# Patient Record
Sex: Male | Born: 1975 | Race: Black or African American | Hispanic: No | Marital: Single | Smoking: Former smoker
Health system: Southern US, Community
[De-identification: ages and names within clinical notes are randomized; demographics above are authoritative.]

## PROBLEM LIST (undated history)

## (undated) DIAGNOSIS — B20 Human immunodeficiency virus [HIV] disease: Secondary | ICD-10-CM

## (undated) DIAGNOSIS — I499 Cardiac arrhythmia, unspecified: Secondary | ICD-10-CM

## (undated) DIAGNOSIS — Z21 Asymptomatic human immunodeficiency virus [HIV] infection status: Secondary | ICD-10-CM

## (undated) DIAGNOSIS — I10 Essential (primary) hypertension: Secondary | ICD-10-CM

---

## 2015-04-08 ENCOUNTER — Encounter (HOSPITAL_BASED_OUTPATIENT_CLINIC_OR_DEPARTMENT_OTHER): Payer: Self-pay

## 2015-04-08 ENCOUNTER — Emergency Department (HOSPITAL_BASED_OUTPATIENT_CLINIC_OR_DEPARTMENT_OTHER)
Admission: EM | Admit: 2015-04-08 | Discharge: 2015-04-08 | Disposition: A | Discharge status: Home / Self Care | Payer: Self-pay | Attending: Emergency Medicine | Admitting: Emergency Medicine

## 2015-04-08 DIAGNOSIS — I10 Essential (primary) hypertension: Secondary | ICD-10-CM | POA: Insufficient documentation

## 2015-04-08 DIAGNOSIS — R197 Diarrhea, unspecified: Secondary | ICD-10-CM | POA: Insufficient documentation

## 2015-04-08 DIAGNOSIS — R112 Nausea with vomiting, unspecified: Secondary | ICD-10-CM | POA: Insufficient documentation

## 2015-04-08 HISTORY — DX: Cardiac arrhythmia, unspecified: I49.9

## 2015-04-08 HISTORY — DX: Essential (primary) hypertension: I10

## 2015-04-08 MED ORDER — PROMETHAZINE HCL 25 MG PO TABS: 25.0000 mg | ORAL_TABLET | Freq: Four times a day (QID) | ORAL | Status: DC | PRN

## 2015-04-08 MED ORDER — ONDANSETRON 4 MG PO TBDP
4.0000 mg | ORAL_TABLET | Freq: Once | ORAL | Status: AC
  Administered 2015-04-08: 4 mg via ORAL
  Filled 2015-04-08: qty 1

## 2015-04-08 NOTE — ED Provider Notes (Signed)
CSN: [id number]     Arrival date & time 04/08/15  2134 History   None    Chief Complaint  Patient presents with  . Diarrhea     (Consider location/radiation/quality/duration/timing/severity/associated sxs/prior Treatment) HPI   Blood pressure 130/75, pulse 74, temperature 98.5 F (36.9 C), temperature source Oral, resp. rate 18, height  (1.753 m), weight 154 lb (69.854 kg), SpO2 100 %.  TYKEE HEIDEMAN is a 39 y.o. male who is otherwise healthy complaining of 3 episodes of nonbloody, nonbilious, coffee-ground emesis and looser than normal stool onset yesterday. Patient denies fever, chills, abdominal pain, change in urination, sick contacts, recent antibiotics use.   Past Medical History  Diagnosis Date  . Hypertension   . Irregular heart beat    History reviewed. No pertinent past surgical history. No family history on file. Social History  Substance Use Topics  . Smoking status: Never Smoker   . Smokeless tobacco: None  . Alcohol Use: No    Review of Systems  10 systems reviewed and found to be negative, except as noted in the HPI.   Allergies  Review of patient's allergies indicates no known allergies.  Home Medications   Prior to Admission medications   Medication Sig Start Date End Date Taking? Authorizing Provider  UNKNOWN TO PATIENT HTN and med for irregular heart beat   Yes Historical Provider, MD  promethazine (PHENERGAN) 25 MG tablet Take 1 tablet (25 mg total) by mouth every 6 (six) hours as needed for nausea or vomiting. 04/08/15   Rue Tinnel, PA-C   BP 130/75 mmHg  Pulse 74  Temp(Src) 98.5 F (36.9 C) (Oral)  Resp 18  Ht  (1.753 m)  Wt 154 lb (69.854 kg)  BMI 22.73 kg/m2  SpO2 100% Physical Exam  Constitutional: He is oriented to person, place, and time. He appears well-developed and well-nourished. No distress.  HENT:  Head: Normocephalic and atraumatic.  Mouth/Throat: Oropharynx is clear and moist.  Eyes: Conjunctivae and EOM  are normal. Pupils are equal, round, and reactive to light.  Neck: Normal range of motion.  Cardiovascular: Normal rate, regular rhythm and intact distal pulses.   Pulmonary/Chest: Effort normal and breath sounds normal.  Abdominal: Soft. There is no tenderness.  Musculoskeletal: Normal range of motion.  Neurological: He is alert and oriented to person, place, and time.  Skin: He is not diaphoretic.  Psychiatric: He has a normal mood and affect.  Nursing note and vitals reviewed.   ED Course  Procedures (including critical care time) Labs Review Labs Reviewed - No data to display  Imaging Review No results found. I have personally reviewed and evaluated these images and lab results as part of my medical decision-making.   EKG Interpretation None      MDM   Final diagnoses:  Nausea vomiting and diarrhea    Filed Vitals:   04/08/15 2141  BP: 130/75  Pulse: 74  Temp: 98.5 F (36.9 C)  TempSrc: Oral  Resp: 18  Height:  (1.753 m)  Weight: 154 lb (69.854 kg)  SpO2: 100%    Medications  ondansetron (ZOFRAN-ODT) disintegrating tablet 4 mg (not administered)    CHEVY SWEIGERT is a pleasant 39 y.o. male presenting with nausea vomiting diarrhea onset yesterday. No abdominal pain. Abdominal exam is benign. Patient with normal vital signs, does not appear clinically dehydrated. We'll give Zofran ODT and by mouth challenge.   Patient is passed by mouth challenge. Will give work note and Phenergan and  advised on infection control mechanisms.  Evaluation does not show pathology that would require ongoing emergent intervention or inpatient treatment. Pt is hemodynamically stable and mentating appropriately. Discussed findings and plan with patient/guardian, who agrees with care plan. All questions answered. Return precautions discussed and outpatient follow up given.   New Prescriptions   PROMETHAZINE (PHENERGAN) 25 MG TABLET    Take 1 tablet (25 mg total) by mouth every  6 (six) hours as needed for nausea or vomiting.         Wynetta Emery, PA-C 04/08/15 2215  Gwyneth Sprout, MD 04/08/15 2242

## 2015-04-08 NOTE — ED Notes (Signed)
Pt able to drink ginger ale without diff

## 2015-04-08 NOTE — ED Notes (Signed)
C/o n/v/d started yesterday

## 2015-04-08 NOTE — ED Notes (Signed)
Pt c/o diarrhea, n,v x 2 days  Denies pain

## 2015-04-08 NOTE — Discharge Instructions (Signed)
Push fluids: take small frequent sips of water or Gatorade, do not drink any soda, juice or caffeinated beverages.   ° °Slowly resume solid diet as desired. Avoid food that are spicy, contain dairy and/or have high fat content. ° °Aviod NSAIDs (aspirin, motrin, ibuprofen, naproxen, Aleve et cetera) for pain control because they will irritate your stomach. ° °Do not hesitate to return to the emergency room for any new, worsening or concerning symptoms. ° °Please obtain primary care using resource guide below. Let them know that you were seen in the emergency room and that they will need to obtain records for further outpatient management. ° ° ° °Emergency Department Resource Guide °1) Find a Doctor and Pay Out of Pocket °Although you won't have to find out who is covered by your insurance plan, it is a good idea to ask around and get recommendations. You will then need to call the office and see if the doctor you have chosen will accept you as a new patient and what types of options they offer for patients who are self-pay. Some doctors offer discounts or will set up payment plans for their patients who do not have insurance, but you will need to ask so you aren't surprised when you get to your appointment. ° °2) Contact Your Local Health Department °Not all health departments have doctors that can see patients for sick visits, but many do, so it is worth a call to see if yours does. If you don't know where your local health department is, you can check in your phone book. The CDC also has a tool to help you locate your state's health department, and many state websites also have listings of all of their local health departments. ° °3) Find a Walk-in Clinic °If your illness is not likely to be very severe or complicated, you may want to try a walk in clinic. These are popping up all over the country in pharmacies, drugstores, and shopping centers. They're usually staffed by nurse practitioners or physician assistants  that have been trained to treat common illnesses and complaints. They're usually fairly quick and inexpensive. However, if you have serious medical issues or chronic medical problems, these are probably not your best option. ° °No Primary Care Doctor: °- Call [facility] at  832-8000 - they can help you locate a primary care doctor that  accepts your insurance, provides certain services, etc. °- Physician Referral Service- 1-800-533-3463 ° °Chronic Pain Problems: °Organization         Address  Phone   Notes  °[facility]  (336) [phone] Patients need to be referred by their primary care doctor.  ° °Medication Assistance: °Organization         Address  Phone   Notes  °[place] [facility] [address] °[place], [place] [address] (336) 641-8030 --Must be a resident of [place] °-- Must have NO insurance coverage whatsoever (no Medicaid/ Medicare, etc.) °-- The pt. MUST have a primary care doctor that directs their care regularly and follows them in the community °  °[facility]  (866) 331-1348   °[employer]  (888) 892-1162   ° °Agencies that provide inexpensive medical care: °Organization         Address  Phone   Notes  °[facility]  (336) 832-8035   °[facility]    (336) 832-7272   °[facility] [address] °[place], [place] [address] (336)   832-4777   °[facility] [address], °[place] (336) 271-4999   °[facility]    (336) 373-0678   °[facility]    (336) 272-1050   °[facility] ° [address], [place] Phone:  (336) 832-4444, Fax:  (336) 832-4440 Hours of Operation:  9 am - 6 pm, M-F.  Also accepts Medicaid/Medicare and self-pay.  °[facility] ° [address], [place] Phone: (336) 832-3150, Fax: (336) [phone]. Hours of Operation:  8:30 am - 5:30 pm, M-F.  Also accepts Medicaid  and self-pay.  °[facility] [place] [address], [place] Phone: (336) 878-6027   °[facility] [address], [place], [place] (336)723-1848, Ext. 123 Mondays & Thursdays: 7-9 AM.  First 15 patients are seen on a first come, first serve basis. °  ° °Medicaid-accepting [place] Providers: ° °Organization         Address  Phone   Notes  °[facility] [address], [place] (336) 641-2100 Also accepts self-pay patients.  °[facility] [address], [place] ° (336) 856-9996   °[facility] [address], [place] (336) 288-8857   °[facility] [address] [place] [address], [place] (336) 299-7000   °[facility] [address], [place]  ° (336) 373-1557 Only accepts [place] Access Medicaid patients after they have their name applied to their card.  ° °Self-Pay (no insurance) in [place]: ° °Organization         Address  Phone   Notes  °Sickle Cell Patients, [facility] [address], [place] (336) 832-1970   °[facility] [address], [place] (336) 832-4400   °[facility] [place] ° [address], [place] (336) 992-4800   °[facility]/Dr. Osei-Bonsu ° [address] [place] [address], [place] or [address], [place] (336) [phone] Phone number for both [place] and [place] locations is the same.  °[facility] [address], [place] (336) 299-0000   °[facility] [address] [place] [address], [place] or [address] (336) 852-7530 °(336) 878-2260   °[facility] [address], [place] (336) 350-1642, phone; (336) 294-5005, fax Sees patients 1st and 3rd Saturday of every month.  Must not qualify for public or private insurance (i.e. Medicaid, Medicare, [place] Health Choice, Veterans' Benefits) • Household income  should be no more than 200% of the poverty level •The clinic cannot treat you if you are pregnant or think you are pregnant • Sexually transmitted diseases are not treated at the clinic.  ° ° °Dental Care: °Organization         Address  Phone  Notes  °[facility] [facility] [address], [place] (336) 641-6152 Accepts children up to age 21 who are enrolled in Medicaid or [place] Health Choice; pregnant women with a Medicaid card; and children who have applied for Medicaid or [place] Health Choice, but were declined, whose parents can pay a reduced fee at time of service.  °[facility] [place]  [address], [place] (336) 641-7733 Accepts children up to age 21 who are enrolled in Medicaid or [place] Health Choice; pregnant women with a   Medicaid card; and children who have applied for Medicaid or [place] Health Choice, but were declined, whose parents can pay a reduced fee at time of service.  °[facility] ° [address], [place] (336) 641-4533 Patients are seen by appointment only. Walk-ins are not accepted. [facility] will see patients 18 years of age and older. °Monday - Tuesday (8am-5pm) °Most Wednesdays (8:30-5pm) °$30 per visit, cash only  °[facility] ° [address], [place] (336) 641-4533 Patients are seen by appointment only. Walk-ins are not accepted. [facility] will see patients 18 years of age and older. °One Wednesday Evening (Monthly: Volunteer Based).  $30 per visit, cash only  °[facility]  (919) 537-3737 for adults; Children under age 4, call Graduate Pediatric Dentistry at (919) [phone]. Children aged 4-14, please call (919) [phone] to request a pediatric application. ° Dental services are provided in all areas of dental care including fillings, crowns and bridges, complete and partial dentures, implants, gum treatment,  root canals, and extractions. Preventive care is also provided. Treatment is provided to both adults and children. °Patients are selected via a lottery and there is often a waiting list. °  °[facility] [address], °[place] ° (336) 763-8833 [email or web address] °  °[facility] [address], [place], [place] (336)723-1848, Ext. 123 Second and Fourth Thursday of each month, opens at 6:30 AM; Clinic ends at 9 AM.  Patients are seen on a first-come first-served basis, and a limited number are seen during each clinic.  ° °[facility] ° [address], [place], [place] (336) 723-7904   Eligibility Requirements °You must have lived in [place], [place], or [place] counties for at least the last three months. °  You cannot be eligible for state or federal sponsored healthcare insurance, including Veterans Administration, Medicaid, or Medicare. °  You generally cannot be eligible for healthcare insurance through your employer.  °  How to apply: °Eligibility screenings are held every Tuesday and Wednesday afternoon from 1:00 pm until 4:00 pm. You do not need an appointment for the interview!  °[facility] [address], [place], [place] 336-631-2330   °[facility]  336-342-8273   °[facility]  336-703-3100   °[facility]  336-570-6415   ° °Behavioral Health Resources in the Community: °Intensive Outpatient Programs °Organization         Address  Phone  Notes  °[facility] [address], [place], [place] 336-878-6098   °[facility] [address], [place], [place] 336-832-9800   °[facility] [address], [place], [place] ° 336-882-2125   °[facility] [address],  °[place], [place] 1-800-853-5163 or 336-641-4981   °Substance Abuse Resources °Organization         Address  Phone  Notes  °[facility]   336-882-2125   °[facility]  336-784-9470   °[facility]  336-285-9073   °[facility]  336-845-3988   °Residential & Outpatient Substance Abuse Program  1-800-659-3381   °Psychological Services °Organization         Address  Phone  Notes  °[facility]  336- 832-9600   °[facility]  336- 378-7881   °[facility] [address], [place] 1-800-853-5163 or 336-641-4981   ° °Mobile Crisis Teams °Organization           Address  Phone  Notes  [facility], Mobile Crisis Care Unit  (253)089-5980   [facility]  [address] [place], [place]   [facility] [address] [place] (249)249-7372    Self-Help/Support Groups Organization         Address  Phone             Notes  [place] [place] - variety of support groups  [place] Call for more information  Narcotics Anonymous (NA), [facility] [address], [employer] [place] [place]  2 meetings at this location   Special educational needs teacher         Address  Phone  Notes  [facility] [place],    [place]  1-236-465-4451   [facility]  [address] [place] [address], [place], [place]   [place], [place] (830)401-9842 Admissions: 8am-3pm M-F  [facility] [address]    [place], [place] 151-761-6073   [facility] [address] [place], [place], [place]   [facility] [address]  [place], [place]   [facility] - Intensive Outpatient [address], [place], [place]   [facility] ([place] [place].,  [place], [place] 1-7316549183 or 201-438-0559   [facility] [address], [place], [place] Accepts Medicaid  [facility] [address],  [place] 1-281-729-8089 Substance Abuse/Addiction Treatment   [facility] Organization         Address  Phone  Notes  [facility]  6407101468   Domenic Schwab, PhD [address] [place], [place]   909-847-1489 or 867-309-0760   [place], [place] 269-275-4843   [facility] [address] [place], [place] 609-342-2648 Insurance/Medicaid/sponsorship through [facility] [address]                                    [place], [place] [phone] [place] [address], [place] 339-383-0842    Dr. Adele Schilder  516-089-2527   [facility] Dept. 1) [address], [place] 2) [place] 3)  [address], [place] [phone]  [phone]   [place] (810)[phone] or (941)[phone] (After Hours)

## 2017-08-04 ENCOUNTER — Other Ambulatory Visit: Payer: Self-pay

## 2017-08-04 ENCOUNTER — Encounter (HOSPITAL_BASED_OUTPATIENT_CLINIC_OR_DEPARTMENT_OTHER): Payer: Self-pay

## 2017-08-04 ENCOUNTER — Emergency Department (HOSPITAL_BASED_OUTPATIENT_CLINIC_OR_DEPARTMENT_OTHER)
Admission: EM | Admit: 2017-08-04 | Discharge: 2017-08-04 | Disposition: A | Discharge status: Home / Self Care | Payer: 59 | Attending: Emergency Medicine | Admitting: Emergency Medicine

## 2017-08-04 DIAGNOSIS — R51 Headache: Secondary | ICD-10-CM

## 2017-08-04 DIAGNOSIS — R519 Headache, unspecified: Secondary | ICD-10-CM

## 2017-08-04 DIAGNOSIS — Z79899 Other long term (current) drug therapy: Secondary | ICD-10-CM | POA: Diagnosis not present

## 2017-08-04 DIAGNOSIS — Z87891 Personal history of nicotine dependence: Secondary | ICD-10-CM | POA: Diagnosis not present

## 2017-08-04 DIAGNOSIS — I10 Essential (primary) hypertension: Secondary | ICD-10-CM | POA: Insufficient documentation

## 2017-08-04 DIAGNOSIS — R197 Diarrhea, unspecified: Secondary | ICD-10-CM | POA: Diagnosis not present

## 2017-08-04 DIAGNOSIS — R112 Nausea with vomiting, unspecified: Secondary | ICD-10-CM

## 2017-08-04 DIAGNOSIS — F1210 Cannabis abuse, uncomplicated: Secondary | ICD-10-CM | POA: Diagnosis not present

## 2017-08-04 LAB — URINALYSIS, ROUTINE W REFLEX MICROSCOPIC
Bilirubin Urine: NEGATIVE
Glucose, UA: NEGATIVE mg/dL
Hgb urine dipstick: NEGATIVE
Ketones, ur: NEGATIVE mg/dL
Leukocytes, UA: NEGATIVE
Nitrite: NEGATIVE
Protein, ur: NEGATIVE mg/dL
Specific Gravity, Urine: 1.02 (ref 1.005–1.030)
pH: 6.5 (ref 5.0–8.0)

## 2017-08-04 LAB — COMPREHENSIVE METABOLIC PANEL
ALT: 16 U/L — ABNORMAL LOW (ref 17–63)
AST: 17 U/L (ref 15–41)
Albumin: 4 g/dL (ref 3.5–5.0)
Alkaline Phosphatase: 50 U/L (ref 38–126)
Anion gap: 7 (ref 5–15)
BUN: 11 mg/dL (ref 6–20)
CO2: 26 mmol/L (ref 22–32)
Calcium: 9.2 mg/dL (ref 8.9–10.3)
Chloride: 107 mmol/L (ref 101–111)
Creatinine, Ser: 0.95 mg/dL (ref 0.61–1.24)
GFR calc Af Amer: >60 mL/min (ref >60)
GFR calc non Af Amer: >60 mL/min (ref >60)
Glucose, Bld: 101 mg/dL — ABNORMAL HIGH (ref 65–99)
Potassium: 4 mmol/L (ref 3.5–5.1)
Sodium: 140 mmol/L (ref 135–145)
Total Bilirubin: 0.4 mg/dL (ref 0.3–1.2)
Total Protein: 7.1 g/dL (ref 6.5–8.1)

## 2017-08-04 LAB — CBC WITH DIFFERENTIAL/PLATELET
Basophils Absolute: 0 10*3/uL (ref 0.0–0.1)
Basophils Relative: 1 %
Eosinophils Absolute: 0.3 10*3/uL (ref 0.0–0.7)
Eosinophils Relative: 8 %
HCT: 41.8 % (ref 39.0–52.0)
Hemoglobin: 14.9 g/dL (ref 13.0–17.0)
Lymphocytes Relative: 55 %
Lymphs Abs: 2.3 10*3/uL (ref 0.7–4.0)
MCH: 35.8 pg — ABNORMAL HIGH (ref 26.0–34.0)
MCHC: 35.6 g/dL (ref 30.0–36.0)
MCV: 100.5 fL — ABNORMAL HIGH (ref 78.0–100.0)
Monocytes Absolute: 0.5 10*3/uL (ref 0.1–1.0)
Monocytes Relative: 12 %
Neutro Abs: 1 10*3/uL — ABNORMAL LOW (ref 1.7–7.7)
Neutrophils Relative %: 24 %
Platelets: 142 10*3/uL — ABNORMAL LOW (ref 150–400)
RBC: 4.16 MIL/uL — ABNORMAL LOW (ref 4.22–5.81)
RDW: 13.1 % (ref 11.5–15.5)
WBC: 4.2 10*3/uL (ref 4.0–10.5)

## 2017-08-04 LAB — LIPASE, BLOOD: Lipase: 94 U/L — ABNORMAL HIGH (ref 11–51)

## 2017-08-04 MED ORDER — SODIUM CHLORIDE 0.9 % IV BOLUS (SEPSIS)
1000.0000 mL | Freq: Once | INTRAVENOUS | Status: AC
  Administered 2017-08-04: 1000 mL via INTRAVENOUS

## 2017-08-04 MED ORDER — ONDANSETRON 4 MG PO TBDP: 4.0000 mg | ORAL_TABLET | Freq: Three times a day (TID) | ORAL | 0 refills | Status: DC | PRN

## 2017-08-04 MED ORDER — ONDANSETRON HCL 4 MG/2ML IJ SOLN
4.0000 mg | Freq: Once | INTRAMUSCULAR | Status: AC
  Administered 2017-08-04: 4 mg via INTRAVENOUS
  Filled 2017-08-04: qty 2

## 2017-08-04 NOTE — ED Triage Notes (Signed)
C/o HA, light headed, n/v started yesterday-diarrhea today-NAD-steady gait

## 2017-08-04 NOTE — ED Provider Notes (Signed)
[facility] EMERGENCY DEPARTMENT Provider Note   CSN: [id number] Arrival date & time: 08/04/17  1133     History   Chief Complaint Chief Complaint  Patient presents with  . Headache    HPI Thomas Jimenez is a 42 y.o. male with history of hypertension, Wolff-Parkinson-White syndrome, and HIV presents today for evaluation of gradual onset, progressively worsening frontal headache which began at around 8 PM last night.  He states that the headache was initially intermittent but is now constant and throbbing in nature, occasionally sharp.  The pain will occasionally radiate to the side of his head and neck depending on his position.  He denies vision changes or photophobia or phonophobia.  He endorses 4 episodes of nonbloody nonbilious emesis and 3 episodes of nonbloody watery stools.  He endorses aching abdominal pain during emesis only but no pain otherwise.  He denies chest pain or shortness of breath.  He endorses lightheadedness but no syncope.  He has not tried anything for his symptoms.  He has known sick contacts but is unsure of their symptoms.  He has been compliant with his HIV medicine and has not missed any doses, viral load was undetectable the last time he followed up with infectious disease.  He denies any recent travel outside of the country.  The history is provided by the patient.    Past Medical History:  Diagnosis Date  . Hypertension   . Irregular heart beat     There are no active problems to display for this patient.   History reviewed. No pertinent surgical history.     Home Medications    Prior to Admission medications   Medication Sig Start Date End Date Taking? Authorizing Provider  CARVEDILOL PO Take by mouth.   Yes [provider]  ondansetron (ZOFRAN ODT) 4 MG disintegrating tablet Take 1 tablet (4 mg total) by mouth every 8 (eight) hours as needed for nausea or vomiting. 08/04/17   Jeanie SewerFawze, Atthew Coutant A, PA-C    Family History No  family history on file.  Social History Social History   Tobacco Use  . Smoking status: Former Games developermoker  . Smokeless tobacco: Never Used  Substance Use Topics  . Alcohol use: Yes    Comment: occ  . Drug use: Yes    Types: Marijuana     Allergies   Patient has no known allergies.   Review of Systems Review of Systems  Constitutional: Negative for chills and fever.  Eyes: Negative for photophobia and visual disturbance.  Respiratory: Negative for shortness of breath.   Cardiovascular: Negative for chest pain.  Gastrointestinal: Positive for abdominal pain (with emesis only), diarrhea, nausea and vomiting. Negative for blood in stool.  Genitourinary: Negative for dysuria and hematuria.  Musculoskeletal: Negative for neck pain and neck stiffness.  Neurological: Positive for light-headedness and headaches. Negative for syncope, weakness and numbness.  All other systems reviewed and are negative.    Physical Exam Updated Vital Signs BP 129/88   Pulse (!) 50   Temp 99.1 F (37.3 C) (Oral)   Resp 16   Ht 5\' 9"  (1.753 m)   Wt 70.8 kg (156 lb)   SpO2 100%   BMI 23.04 kg/m   Physical Exam  Constitutional: He is oriented to person, place, and time. He appears well-developed and well-nourished. No distress.  HENT:  Head: Normocephalic and atraumatic.  No tenderness to palpation of the face or skull. No deformity, crepitus, or swelling noted.   Eyes: Conjunctivae  and EOM are normal. Pupils are equal, round, and reactive to light. Right eye exhibits no discharge. Left eye exhibits no discharge. Right eye exhibits normal extraocular motion and no nystagmus. Left eye exhibits normal extraocular motion and no nystagmus.  Neck: Normal range of motion. Neck supple. No JVD present. No neck rigidity. No tracheal deviation present. No Brudzinski's sign and no Kernig's sign noted.  No midline spine TTP, no paraspinal muscle tenderness, no deformity, crepitus, or step-off noted     Cardiovascular: Normal rate, regular rhythm, normal heart sounds and intact distal pulses.  Pulmonary/Chest: Effort normal and breath sounds normal.  Abdominal: Soft. Bowel sounds are normal. He exhibits no distension and no mass. There is no tenderness. There is no guarding.  No CVAT  Musculoskeletal: Normal range of motion. He exhibits no edema or tenderness.  Neurological: He is alert and oriented to person, place, and time. He has normal strength. He is not disoriented. No cranial nerve deficit or sensory deficit. Coordination normal. GCS eye subscore is 4. GCS verbal subscore is 5. GCS motor subscore is 6.  Mental Status:  Alert, thought content appropriate, able to give a coherent history. Speech fluent without evidence of aphasia. Able to follow 2 step commands without difficulty.  Cranial Nerves:  II:  pupils equal, round, reactive to light III,IV, VI: ptosis not present, extra-ocular motions intact bilaterally  V,VII: smile symmetric, facial light touch sensation equal VIII: hearing grossly normal to voice  X: uvula elevates symmetrically  XI: bilateral shoulder shrug symmetric and strong XII: midline tongue extension without fassiculations Motor:  Normal tone. 5/5 strength of BUE and BLE major muscle groups including strong and equal grip strength and dorsiflexion/plantar flexion Sensory: light touch normal in all extremities. Cerebellar: normal finger-to-nose with bilateral upper extremities Gait: normal gait and balance. CV: 2+ radial and DP/PT pulses   Skin: Skin is warm and dry. No erythema.  Psychiatric: He has a normal mood and affect. His behavior is normal.  Nursing note and vitals reviewed.    ED Treatments / Results  Labs (all labs ordered are listed, but only abnormal results are displayed) Labs Reviewed  COMPREHENSIVE METABOLIC PANEL - Abnormal; Notable for the following components:      Result Value   Glucose, Bld 101 (*)    ALT 16 (*)    All other  components within normal limits  CBC WITH DIFFERENTIAL/PLATELET - Abnormal; Notable for the following components:   RBC 4.16 (*)    MCV 100.5 (*)    MCH 35.8 (*)    Platelets 142 (*)    Neutro Abs 1.0 (*)    All other components within normal limits  LIPASE, BLOOD - Abnormal; Notable for the following components:   Lipase 94 (*)    All other components within normal limits  URINALYSIS, ROUTINE W REFLEX MICROSCOPIC    EKG  EKG Interpretation  Date/Time:  Wednesday August 04 2017 12:17:15 EST Ventricular Rate:  60 PR Interval:    QRS Duration: 100 QT Interval:  389 QTC Calculation: 389 R Axis:   70 Text Interpretation:  Sinus arrhythmia otherwise no acute ST/T changes No old tracing to compare Confirmed by Pricilla Loveless [phone]) on 08/04/2017 12:35:14 PM       Radiology No results found.  Procedures Procedures (including critical care time)  Medications Ordered in ED Medications  sodium chloride 0.9 % bolus 1,000 mL (0 mLs Intravenous Stopped 08/04/17 1303)  ondansetron (ZOFRAN) injection 4 mg (4 mg Intravenous Given 08/04/17 1226)  Initial Impression / Assessment and Plan / ED Course  I have reviewed the triage vital signs and the nursing notes.  Pertinent labs & imaging results that were available during my care of the patient were reviewed by me and considered in my medical decision making (see chart for details).     Patient presents with a few episodes of nausea, vomiting, and diarrhea as well as a mild 3/10 frontal headache.  Afebrile, vital signs are at patient's baseline.  He is nontoxic in appearance.  He is HIV positive, however he is compliant with his medication and viral load was undetectable at last infectious disease visit with a healthy CD4 count.  No nuchal rigidity or meningeal signs to suggest meningitis.  No focal neurologic deficits on examination and I doubt ICH, SAH, or acute intracranial abnormality.  Abdomen is nontender and unremarkable on  examination.  UA is  unconcerning for UTI or nephrolithiasis.  No leukocytosis or anemia.  No significant electrolyte abnormalities.  LFTs and creatinine are within normal limits.  Lipase is mildly elevated however not so elevated as to be concerning for acute pancreatitis.  On reevaluation, patient is resting comfortably and states he is feeling much better after fluids and Zofran.  He is tolerating p.o. food and fluids without difficulty.  Repeat abdominal examination remains unremarkable.  No further emergent workup required at this time.  I doubt obstruction, perforation, appendicitis, or other acute surgical abdominal pathology.  He is stable for discharge home with supportive treatment and Zofran.  Discussed indications for return to the ED.  He will follow-up with his primary care physician for reevaluation of symptoms if they persist. Pt verbalized understanding of and agreement with plan and is safe for discharge home at this time.  He has no complaints prior to discharge.  Final Clinical Impressions(s) / ED Diagnoses   Final diagnoses:  Nausea vomiting and diarrhea  Frontal headache    ED Discharge Orders        Ordered    ondansetron (ZOFRAN ODT) 4 MG disintegrating tablet  Every 8 hours PRN     08/04/17 1407       Jeanie Sewer, PA-C 08/04/17 1417    Pricilla Loveless, MD 08/04/17 1458

## 2017-08-04 NOTE — Discharge Instructions (Signed)
Alternate 600 mg of ibuprofen and [phone] mg of Tylenol every 3 hours as needed for pain. Do not exceed 4000 mg of Tylenol daily.  Take Zofran as needed for nausea and vomiting.  Wait around 20 minutes prior to eating something after taking this medicine.  Advance the diet slowly over the next few days.  Drink plenty of fluids and get plenty of rest.  Eat a diet of bland foods that will not upset your stomach such as clear broth, mashed potatoes, saltine crackers, ginger ale, and plain chicken.  Follow-up with your primary care physician if symptoms persist.  Return to the emergency department immediately for any concerning signs or symptoms develop such as fevers, neck stiffness, severe pain, blood in the urine or stool, or if you are unable to keep any food or drink down.

## 2017-08-04 NOTE — ED Notes (Signed)
Graham crackers and Ginger Ale given for PO challenge.

## 2018-03-18 ENCOUNTER — Other Ambulatory Visit: Payer: Self-pay

## 2018-03-18 ENCOUNTER — Encounter (HOSPITAL_BASED_OUTPATIENT_CLINIC_OR_DEPARTMENT_OTHER): Payer: Self-pay

## 2018-03-18 ENCOUNTER — Emergency Department (HOSPITAL_BASED_OUTPATIENT_CLINIC_OR_DEPARTMENT_OTHER)
Admission: EM | Admit: 2018-03-18 | Discharge: 2018-03-18 | Disposition: A | Discharge status: Home / Self Care | Payer: PRIVATE HEALTH INSURANCE | Attending: Emergency Medicine | Admitting: Emergency Medicine

## 2018-03-18 DIAGNOSIS — L02211 Cutaneous abscess of abdominal wall: Secondary | ICD-10-CM | POA: Insufficient documentation

## 2018-03-18 DIAGNOSIS — Z87891 Personal history of nicotine dependence: Secondary | ICD-10-CM | POA: Diagnosis not present

## 2018-03-18 DIAGNOSIS — R1031 Right lower quadrant pain: Secondary | ICD-10-CM | POA: Diagnosis present

## 2018-03-18 DIAGNOSIS — B20 Human immunodeficiency virus [HIV] disease: Secondary | ICD-10-CM | POA: Diagnosis not present

## 2018-03-18 DIAGNOSIS — I10 Essential (primary) hypertension: Secondary | ICD-10-CM | POA: Diagnosis not present

## 2018-03-18 DIAGNOSIS — R599 Enlarged lymph nodes, unspecified: Secondary | ICD-10-CM | POA: Insufficient documentation

## 2018-03-18 HISTORY — DX: Human immunodeficiency virus (HIV) disease: B20

## 2018-03-18 HISTORY — DX: Asymptomatic human immunodeficiency virus (hiv) infection status: Z21

## 2018-03-18 MED ORDER — SULFAMETHOXAZOLE-TRIMETHOPRIM 800-160 MG PO TABS
1.0000 | ORAL_TABLET | Freq: Once | ORAL | Status: AC
  Administered 2018-03-18: 1 via ORAL
  Filled 2018-03-18: qty 1

## 2018-03-18 MED ORDER — SULFAMETHOXAZOLE-TRIMETHOPRIM 800-160 MG PO TABS: 1.0000 | ORAL_TABLET | Freq: Two times a day (BID) | ORAL | 0 refills | Status: AC

## 2018-03-18 MED ORDER — LIDOCAINE-EPINEPHRINE (PF) 2 %-1:200000 IJ SOLN
10.0000 mL | Freq: Once | INTRAMUSCULAR | Status: AC
  Administered 2018-03-18: 10 mL
  Filled 2018-03-18 (×2): qty 10

## 2018-03-18 MED FILL — SULFAMETHOXAZOLE-TMP DS TAB: 800-160 | 7 days supply | Qty: 14 | Fill #0

## 2018-03-18 NOTE — Discharge Instructions (Signed)
Your abscess was drained, you will need to take antibiotics as directed for the next week, you have packing in place that will need to be removed in 2 days, please return to the ED, urgent care or follow-up with your primary care doctor for a wound check.  In the meantime please use warm soaks 2-3 times daily to help promote drainage and healing.  You notice worsening pain, redness, increasing drainage, fevers, vomiting or any other new or concerning symptoms return to the ED for reevaluation. Lump that you noticed in your groin today is likely a reactive lymph node from the infection and abscess, monitor this as it should improve as your infection goes away.

## 2018-03-18 NOTE — ED Notes (Signed)
ED Provider at bedside. 

## 2018-03-18 NOTE — ED Provider Notes (Signed)
[facility] EMERGENCY DEPARTMENT Provider Note   CSN: [id number] Arrival date & time: 03/18/18  1312     History   Chief Complaint Chief Complaint  Patient presents with  . Abscess  . Groin Pain    HPI Thomas Jimenez is a 42 y.o. male.  Thomas Jimenez is a 42 y.o. Male with a history of HIV, hypertension and irregular heartbeat, presents to the emergency department for evaluation of abscess over the right lower quadrant of his abdomen.  Patient reports it first started as a small pimple, and over the past 2 weeks his grown in size and become increasingly painful, red and warm.  He has not been able to express any drainage from the area.  Does report history of abscesses in the past on his buttocks or armpits but never over the abdominal wall.  Does not think he was bit by any insects.  No abscesses elsewhere.  Notes some chills yesterday, and had a low-grade fever on arrival.  He has not had any nausea or vomiting, aside from directly around the abscess he has no other abdominal pain.  Patient has noted a small lump in his groin, he is felt to before but over the past day or 2 it his become bigger and he wonders if this could be a hernia.  He has not had any swelling or pain in his testicles or scrotum, no dysuria or urinary frequency.  He reports he takes his HIV antivirals regularly, is followed by [facility] viral load has been undetectable with good CD4 count at last visit about 6 months ago.,      Past Medical History:  Diagnosis Date  . HIV (human immunodeficiency virus infection) (HCC)   . Hypertension   . Irregular heart beat     There are no active problems to display for this patient.   History reviewed. No pertinent surgical history.      Home Medications    Prior to Admission medications   Medication Sig Start Date End Date Taking? Authorizing Provider  Abacavir-Dolutegravir-Lamivud (TRIUMEQ PO) Take by mouth.   Yes [provider]    CARVEDILOL PO Take by mouth.   Yes [provider]  ondansetron (ZOFRAN ODT) 4 MG disintegrating tablet Take 1 tablet (4 mg total) by mouth every 8 (eight) hours as needed for nausea or vomiting. 08/04/17   Luevenia Maxin, Mina A, PA-C  sulfamethoxazole-trimethoprim (BACTRIM DS,SEPTRA DS) 800-160 MG tablet Take 1 tablet by mouth 2 (two) times daily for 7 days. 03/18/18 03/25/18  Dartha Lodge, PA-C    Family History No family history on file.  Social History Social History   Tobacco Use  . Smoking status: Former Games developer  . Smokeless tobacco: Never Used  Substance Use Topics  . Alcohol use: Yes    Comment: occ  . Drug use: Yes    Types: Marijuana     Allergies   Other   Review of Systems Review of Systems  Constitutional: Positive for chills. Negative for fever.  HENT: Negative.   Respiratory: Negative for shortness of breath.   Cardiovascular: Negative for chest pain.  Gastrointestinal: Negative for abdominal pain, constipation, diarrhea, nausea and vomiting.  Genitourinary: Negative for dysuria, frequency, scrotal swelling and testicular pain.  Musculoskeletal: Negative for arthralgias and myalgias.  Skin: Positive for color change.       Abscess  Hematological: Positive for adenopathy.  All other systems reviewed and are negative.    Physical Exam Updated Vital  Signs BP 115/72 (BP Location: Right Arm)   Pulse (!) 56   Temp 99.8 F (37.7 C) (Oral)   Resp 16   Ht 5\' 9"  (1.753 m)   Wt 76.4 kg   SpO2 100%   BMI 24.88 kg/m   Physical Exam  Constitutional: He appears well-developed and well-nourished. No distress.  HENT:  Head: Normocephalic and atraumatic.  Eyes: Right eye exhibits no discharge. Left eye exhibits no discharge.  Cardiovascular: Normal rate, regular rhythm, normal heart sounds and intact distal pulses.  Pulmonary/Chest: Effort normal and breath sounds normal. No stridor. No respiratory distress. He has no wheezes. He has no rales.  Respirations  equal and unlabored, patient able to speak in full sentences, lungs clear to auscultation bilaterally  Abdominal: Soft. Bowel sounds are normal. He exhibits no distension and no mass. There is no tenderness. There is no guarding.  There is a 3 x 3 cm of erythema, fluctuance and induration just to the right of the navel, there is a small palpable head but no expressible drainage, small amount of surrounding cellulitis, this appears to be superficial.  No other abdominal tenderness, abdomen is soft, nondistended with bowel sounds present throughout  Genitourinary:  Genitourinary Comments: There is some palpable inguinal lymphadenopathy in the right, this is easily movable, not fluctuant, indurated, no overlying erythema or warmth, does not increase in size with Valsalva, no evidence of entrance into the inguinal canal  Musculoskeletal: He exhibits no edema or deformity.  Neurological: He is alert. Coordination normal.  Skin: Skin is warm and dry. He is not diaphoretic.  Psychiatric: He has a normal mood and affect. His behavior is normal.  Nursing note and vitals reviewed.    ED Treatments / Results  Labs (all labs ordered are listed, but only abnormal results are displayed) Labs Reviewed - No data to display  EKG None  Radiology No results found.  Procedures  EMERGENCY DEPARTMENT US SOFT TISSUE INTERPRETATION "Study: Limited Soft Tissue Ultrasound"  INDICATIONS: Soft tissue infection Multiple views of the body part were obtained in real-time with a multi-frequency linear probe  PERFORMED BY: Myself IMAGES ARCHIVED?: Yes SIDE:Right  BODY PART:Abdominal wall and Pelvic wall INTERPRETATION:  Abcess present and Cellulitis present, bump noted by the patient in the inguinal area ultrasounded as well and has characteristics most digestive of lymph node, he is fluid collection, and this area is vascularized    .Marland KitchenIncision and Drainage Date/Time: 03/18/2018 4:04 PM Performed by: Dartha Lodge, PA-C Authorized by: Dartha Lodge, PA-C   Consent:    Consent obtained:  Verbal   Consent given by:  Patient   Risks discussed:  Bleeding, incomplete drainage, damage to other organs, infection and pain   Alternatives discussed:  No treatment Location:    Type:  Abscess   Size:  2 x 2 cm   Location:  Trunk   Trunk location:  Abdomen Pre-procedure details:    Skin preparation:  Betadine Anesthesia (see MAR for exact dosages):    Anesthesia method:  Local infiltration   Local anesthetic:  Lidocaine 2% WITH epi Procedure type:    Complexity:  Simple Procedure details:    Incision types:  Single straight   Incision depth:  Dermal   Scalpel blade:  11   Wound management:  Probed and deloculated and irrigated with saline   Drainage:  Purulent   Drainage amount:  Copious   Wound treatment:  Wound left open   Packing materials:  1/4 in iodoform  gauze Post-procedure details:    Patient tolerance of procedure:  Tolerated well, no immediate complications   (including critical care time)  Medications Ordered in ED Medications  lidocaine-EPINEPHrine (XYLOCAINE W/EPI) 2 %-1:200000 (PF) injection 10 mL (10 mLs Infiltration Given by Other 03/18/18 1443)  sulfamethoxazole-trimethoprim (BACTRIM DS,SEPTRA DS) 800-160 MG per tablet 1 tablet (1 tablet Oral Given 03/18/18 1443)     Initial Impression / Assessment and Plan / ED Course  I have reviewed the triage vital signs and the nursing notes.  Pertinent labs & imaging results that were available during my care of the patient were reviewed by me and considered in my medical decision making (see chart for details).  Patient presents for evaluation of abscess over the right lower quadrant abdominal wall, started as a small pimple but has gotten increasingly larger over the past 2 weeks.  Has had some chills, low-grade fever here today but otherwise vitals are normal.  Patient is well-appearing and in no acute distress.  No nausea or  vomiting, no abdominal pain outside of the abscess.  Patient has noted a small lump in his groin and wondered if this could be a hernia, on exam he has a palpable easily movable and soft mass in the anal area most consistent with a lymph node.  Bedside ultrasound used to assess abscess which appears to be superficial and approximately 2 x 2 cm, plan for bedside I&D.  Sound of the inguinal lump noted is consistent with lymphadenopathy, it is vascularized, likely enlarged and noticed more so by the patient in the setting of infection.   Abscess anesthetized locally and drained with copious amounts of purulent fluid, due to location of the packing was laced.  Given patient's HIV history will put patient on Bactrim, first dose given here in the emergency department.  Instructed on appropriate wound care and warm soaks.  Wound check in 2 days for packing removal.  Discussed with patient that I think the lump he noted is likely reactive lymphadenopathy and that he should monitor this closely over the next few days and see if it improves if not he should follow-up with his primary care doctor.  Return precautions been discussed with the patient.  He expresses understanding and is in agreement with plan.  Stable for discharge home at this time.  Final Clinical Impressions(s) / ED Diagnoses   Final diagnoses:  Abscess of skin of abdomen  Reactive lymphadenopathy    ED Discharge Orders         Ordered    sulfamethoxazole-trimethoprim (BACTRIM DS,SEPTRA DS) 800-160 MG tablet  2 times daily     03/18/18 1528           Jodi GeraldsFord, Danilynn Jemison FunkstownN, [place] JerseyPA-C 03/18/18 1619    Pricilla LovelessGoldston, Scott, MD 03/21/18 [phone]

## 2018-03-18 NOTE — ED Triage Notes (Signed)
Pt reports an abscess on his abdomen that started off as a little bump but has became bigger over time. Pt also reports some groin pain and possible hernia.

## 2018-03-20 ENCOUNTER — Other Ambulatory Visit: Payer: Self-pay

## 2018-03-20 ENCOUNTER — Emergency Department (HOSPITAL_BASED_OUTPATIENT_CLINIC_OR_DEPARTMENT_OTHER)
Admission: EM | Admit: 2018-03-20 | Discharge: 2018-03-20 | Disposition: A | Discharge status: Home / Self Care | Payer: PRIVATE HEALTH INSURANCE | Attending: Emergency Medicine | Admitting: Emergency Medicine

## 2018-03-20 ENCOUNTER — Encounter (HOSPITAL_BASED_OUTPATIENT_CLINIC_OR_DEPARTMENT_OTHER): Payer: Self-pay

## 2018-03-20 DIAGNOSIS — Z4800 Encounter for change or removal of nonsurgical wound dressing: Secondary | ICD-10-CM | POA: Insufficient documentation

## 2018-03-20 DIAGNOSIS — Z87891 Personal history of nicotine dependence: Secondary | ICD-10-CM | POA: Insufficient documentation

## 2018-03-20 DIAGNOSIS — Z79899 Other long term (current) drug therapy: Secondary | ICD-10-CM | POA: Insufficient documentation

## 2018-03-20 DIAGNOSIS — I10 Essential (primary) hypertension: Secondary | ICD-10-CM | POA: Insufficient documentation

## 2018-03-20 NOTE — ED Triage Notes (Signed)
Pt had an abscess drained x 2 days ago and has returned to having the packing taken out.

## 2018-03-20 NOTE — ED Provider Notes (Signed)
[facility] EMERGENCY DEPARTMENT Provider Note   CSN: [id number] Arrival date & time: 03/20/18  1638     History   Chief Complaint Chief Complaint  Patient presents with  . Wound Check    HPI Thomas Jimenez is a 42 y.o. male.  HPI  Patient is a 42 year old male who underwent abscess drainage 2 days ago in the emergency department on his right anterior abdomen.  He presents requesting removal of his packing.  He feels like his abscess is better.  He is having no fevers.  He has no surrounding redness.  He is doing much better.  He only has mild pain with palpation.  No other complaints    Past Medical History:  Diagnosis Date  . HIV (human immunodeficiency virus infection) (HCC)   . Hypertension   . Irregular heart beat     There are no active problems to display for this patient.   History reviewed. No pertinent surgical history.      Home Medications    Prior to Admission medications   Medication Sig Start Date End Date Taking? Authorizing Provider  Abacavir-Dolutegravir-Lamivud (TRIUMEQ PO) Take by mouth.    [provider]  CARVEDILOL PO Take by mouth.    [provider]  ondansetron (ZOFRAN ODT) 4 MG disintegrating tablet Take 1 tablet (4 mg total) by mouth every 8 (eight) hours as needed for nausea or vomiting. 08/04/17   Luevenia Maxin, Mina A, PA-C  sulfamethoxazole-trimethoprim (BACTRIM DS,SEPTRA DS) 800-160 MG tablet Take 1 tablet by mouth 2 (two) times daily for 7 days. 03/18/18 03/25/18  Dartha Lodge, PA-C    Family History No family history on file.  Social History Social History   Tobacco Use  . Smoking status: Former Games developer  . Smokeless tobacco: Never Used  Substance Use Topics  . Alcohol use: Yes    Comment: occ  . Drug use: Yes    Types: Marijuana     Allergies   Other   Review of Systems Review of Systems  All other systems reviewed and are negative.    Physical Exam Updated Vital Signs BP 118/64 (BP  Location: Left Arm)   Pulse 61   Temp 98.2 F (36.8 C) (Oral)   Resp 16   Ht 5\' 9"  (1.753 m)   Wt 76 kg   SpO2 100%   BMI 24.74 kg/m   Physical Exam  Constitutional: He is oriented to person, place, and time. He appears well-developed and well-nourished.  HENT:  Head: Normocephalic.  Eyes: EOM are normal.  Neck: Normal range of motion.  Pulmonary/Chest: Effort normal.  Abdominal: He exhibits no distension.  Healing abscess right anterior abdomen without surrounding erythema.  No drainage.  Some small surrounding induration remains.  Packing in place  Musculoskeletal: Normal range of motion.  Neurological: He is alert and oriented to person, place, and time.  Psychiatric: He has a normal mood and affect.  Nursing note and vitals reviewed.    ED Treatments / Results  Labs (all labs ordered are listed, but only abnormal results are displayed) Labs Reviewed - No data to display  EKG None  Radiology No results found.  Procedures Procedures (including critical care time)  Medications Ordered in ED Medications - No data to display   Initial Impression / Assessment and Plan / ED Course  I have reviewed the triage vital signs and the nursing notes.  Pertinent labs & imaging results that were available during my care of the patient  were reviewed by me and considered in my medical decision making (see chart for details).    Packing removed without difficulty.  No signs of infection.  Primary care follow-up  Final Clinical Impressions(s) / ED Diagnoses   Final diagnoses:  Abscess packing removal    ED Discharge Orders    None       Azalia Bilisampos, Lovey Crupi, MD 03/20/18 1730

## 2018-05-05 ENCOUNTER — Emergency Department (HOSPITAL_BASED_OUTPATIENT_CLINIC_OR_DEPARTMENT_OTHER)
Admission: EM | Admit: 2018-05-05 | Discharge: 2018-05-05 | Disposition: A | Discharge status: Home / Self Care | Payer: Self-pay | Attending: Emergency Medicine | Admitting: Emergency Medicine

## 2018-05-05 ENCOUNTER — Emergency Department (HOSPITAL_BASED_OUTPATIENT_CLINIC_OR_DEPARTMENT_OTHER): Payer: Self-pay

## 2018-05-05 ENCOUNTER — Other Ambulatory Visit: Payer: Self-pay

## 2018-05-05 ENCOUNTER — Encounter (HOSPITAL_BASED_OUTPATIENT_CLINIC_OR_DEPARTMENT_OTHER): Payer: Self-pay | Admitting: Emergency Medicine

## 2018-05-05 DIAGNOSIS — M25531 Pain in right wrist: Secondary | ICD-10-CM | POA: Insufficient documentation

## 2018-05-05 DIAGNOSIS — I10 Essential (primary) hypertension: Secondary | ICD-10-CM | POA: Insufficient documentation

## 2018-05-05 DIAGNOSIS — B20 Human immunodeficiency virus [HIV] disease: Secondary | ICD-10-CM | POA: Insufficient documentation

## 2018-05-05 DIAGNOSIS — Z79899 Other long term (current) drug therapy: Secondary | ICD-10-CM | POA: Insufficient documentation

## 2018-05-05 DIAGNOSIS — Z87891 Personal history of nicotine dependence: Secondary | ICD-10-CM | POA: Insufficient documentation

## 2018-05-05 MED ORDER — NAPROXEN 500 MG PO TABS: 500.0000 mg | ORAL_TABLET | Freq: Two times a day (BID) | ORAL | 0 refills | Status: DC

## 2018-05-05 NOTE — ED Provider Notes (Signed)
[facility] EMERGENCY DEPARTMENT Provider Note   CSN: [id number] Arrival date & time: 05/05/18  2131  History   Chief Complaint Chief Complaint  Patient presents with  . Wrist Pain    HPI Thomas Jimenez is a 42 y.o. male with past medical history significant for HIV and HTN who presents for evaluation of wrist pain. Patient states this pain began approximately 2 days ago. States he "might have hit it." Denies fever, chills, nausea, vomiting, erythema, redness, warmth or swelling to wrist, urinary symptoms or discharge. Denies aggravating or alleviating factors. Has not taken anything for his pain. Denies IVDU.  Rates his pain a 4/10.  Pain does not radiate.  History obtained from patient. No interpretor was used. HPI  Past Medical History:  Diagnosis Date  . HIV (human immunodeficiency virus infection) (HCC)   . Hypertension   . Irregular heart beat     There are no active problems to display for this patient.   History reviewed. No pertinent surgical history.      Home Medications    Prior to Admission medications   Medication Sig Start Date End Date Taking? Authorizing Provider  Abacavir-Dolutegravir-Lamivud (TRIUMEQ PO) Take by mouth.    [provider]  CARVEDILOL PO Take by mouth.    [provider]  naproxen (NAPROSYN) 500 MG tablet Take 1 tablet (500 mg total) by mouth 2 (two) times daily. 05/05/18   Janeil Schexnayder A, PA-C    Family History No family history on file.  Social History Social History   Tobacco Use  . Smoking status: Former Games developer  . Smokeless tobacco: Never Used  Substance Use Topics  . Alcohol use: Yes    Comment: occ  . Drug use: Yes    Types: Marijuana     Allergies   Other   Review of Systems Review of Systems  Constitutional: Negative.   Respiratory: Negative.   Cardiovascular: Negative.   Gastrointestinal: Negative.   Genitourinary: Negative.   Musculoskeletal:       Right wrist pain    Skin: Negative.   Neurological: Negative.   All other systems reviewed and are negative.    Physical Exam Updated Vital Signs BP 108/74 (BP Location: Right Arm)   Pulse 66   Temp 98.9 F (37.2 C) (Oral)   Resp 20   SpO2 100%   Physical Exam  Constitutional: He appears well-developed and well-nourished. No distress.  HENT:  Head: Atraumatic.  Eyes: Pupils are equal, round, and reactive to light.  Neck: Normal range of motion. Neck supple.  Cardiovascular: Normal rate and regular rhythm.  Pulmonary/Chest: Effort normal. No respiratory distress.  Abdominal: Soft. He exhibits no distension.  Musculoskeletal: Normal range of motion.  Mild tenderness to ventral surface of right wrist. No edema, erythema or warmth. No ecchymosis to wrist.  Full range of motion of right wrist with flexion, extension, radial deviation as well as ulnar deviation.  Patient is able to pronate and supinate without difficulty. 5/5 grip strength. No tenderness to scaphoid.  No bony step-offs.  Neurological: He is alert.  Intact sensation to sharp and dull right upper extremity.  Skin: Skin is warm and dry. He is not diaphoretic.  No edema, erythema or warmth to right wrist or hand.  No evidence of fluctuance or induration to wrist or hand.  No evidence of ecchymosis. No rashes, lesions or evidence of skin breakdown.  Psychiatric: He has a normal mood and affect.  Nursing note and vitals reviewed.  ED Treatments / Results  Labs (all labs ordered are listed, but only abnormal results are displayed) Labs Reviewed - No data to display  EKG None  Radiology Dg Wrist Complete Right  Result Date: 05/05/2018 CLINICAL DATA:  41 year old male with right wrist pain. No known injury. EXAM: RIGHT WRIST - COMPLETE 3+ VIEW COMPARISON:  None. FINDINGS: There is no evidence of fracture or dislocation. There is no evidence of arthropathy or other focal bone abnormality. Soft tissues are unremarkable. IMPRESSION:  Negative. Electronically Signed   By: Elgie Collard M.D.   On: 05/05/2018 23:33    Procedures Procedures (including critical care time)  Medications Ordered in ED Medications - No data to display   Initial Impression / Assessment and Plan / ED Course  I have reviewed the triage vital signs and the nursing notes.  Pertinent labs & imaging results that were available during my care of the patient were reviewed by me and considered in my medical decision making (see chart for details).  42 year old male who appears otherwise well presents for evaluation of right wrist pain.  Afebrile, nonseptic, non-ill-appearing. Pain began 2 days ago.  States he "might have hit it."  No evidence of erythema, warmth or edema to wrist or hand.  Normal musculoskeletal exam.  Neurovascularly intact.  No bony tenderness on exam.  No evidence of induration or fluctuance to wrist or hand. Low suspicion for septic joint, hemarthrosis or gouty joint.  No evidence of cellulitis. Denies IVDU. Will obtain plain film to rule out fracture or dislocation given possible injury.  Plain film negative for fracture or dislocation. Soft tissue unremarkable. Discussed with patient strict return precautions. Naproxen for pain relief. Patient voiced understanding and is agreeable for follow up.   Final Clinical Impressions(s) / ED Diagnoses   Final diagnoses:  Right wrist pain    ED Discharge Orders         Ordered    naproxen (NAPROSYN) 500 MG tablet  2 times daily     05/05/18 2243           Maxim Bedel A, PA-C 05/06/18 0000    Charlynne Pander, MD 05/11/18 1324

## 2018-05-05 NOTE — ED Triage Notes (Signed)
Right wrist pain, today. No recent injury. No deformity noted.

## 2018-05-05 NOTE — Discharge Instructions (Addendum)
You were evaluated today for wrist pain. I have prescribed you Naproxen. Please take as prescribed. Please follow up with your PCP for re-evaluation.  Return to the ED with any new or worsening symptoms such as: Redness, warmth, swelling, decreased range of motion to extremity.

## 2020-02-04 IMAGING — DX DG WRIST COMPLETE 3+V*R*
4 series · 4 of 4 positions shown · non-contrast
Comparison: None.

CLINICAL DATA: 41-year-old male with right wrist pain. No known
injury.

EXAM:
RIGHT WRIST - COMPLETE 3+ VIEW

[wrist pa]
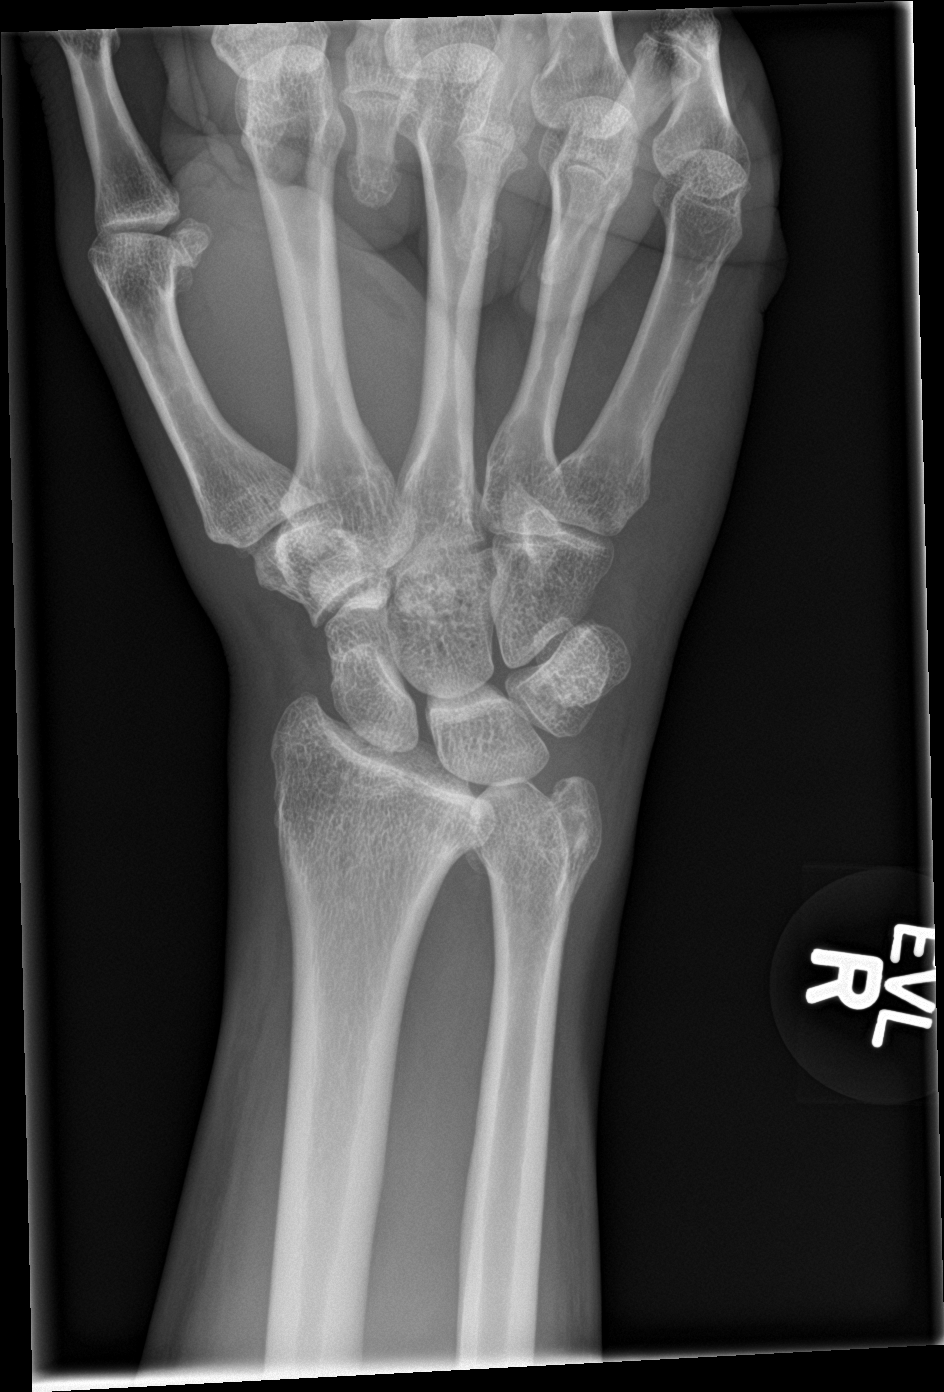

[wrist obl]
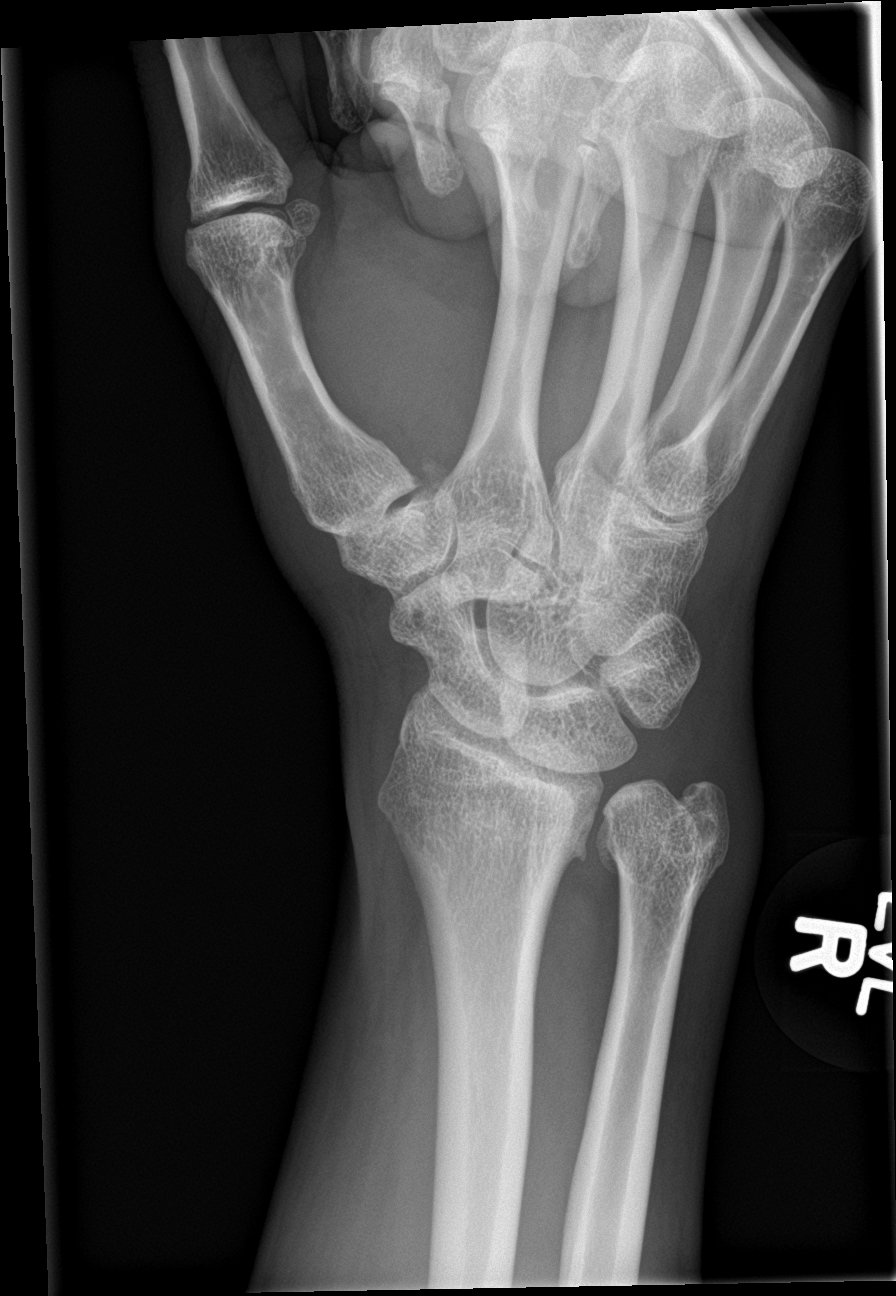

[wrist lat]
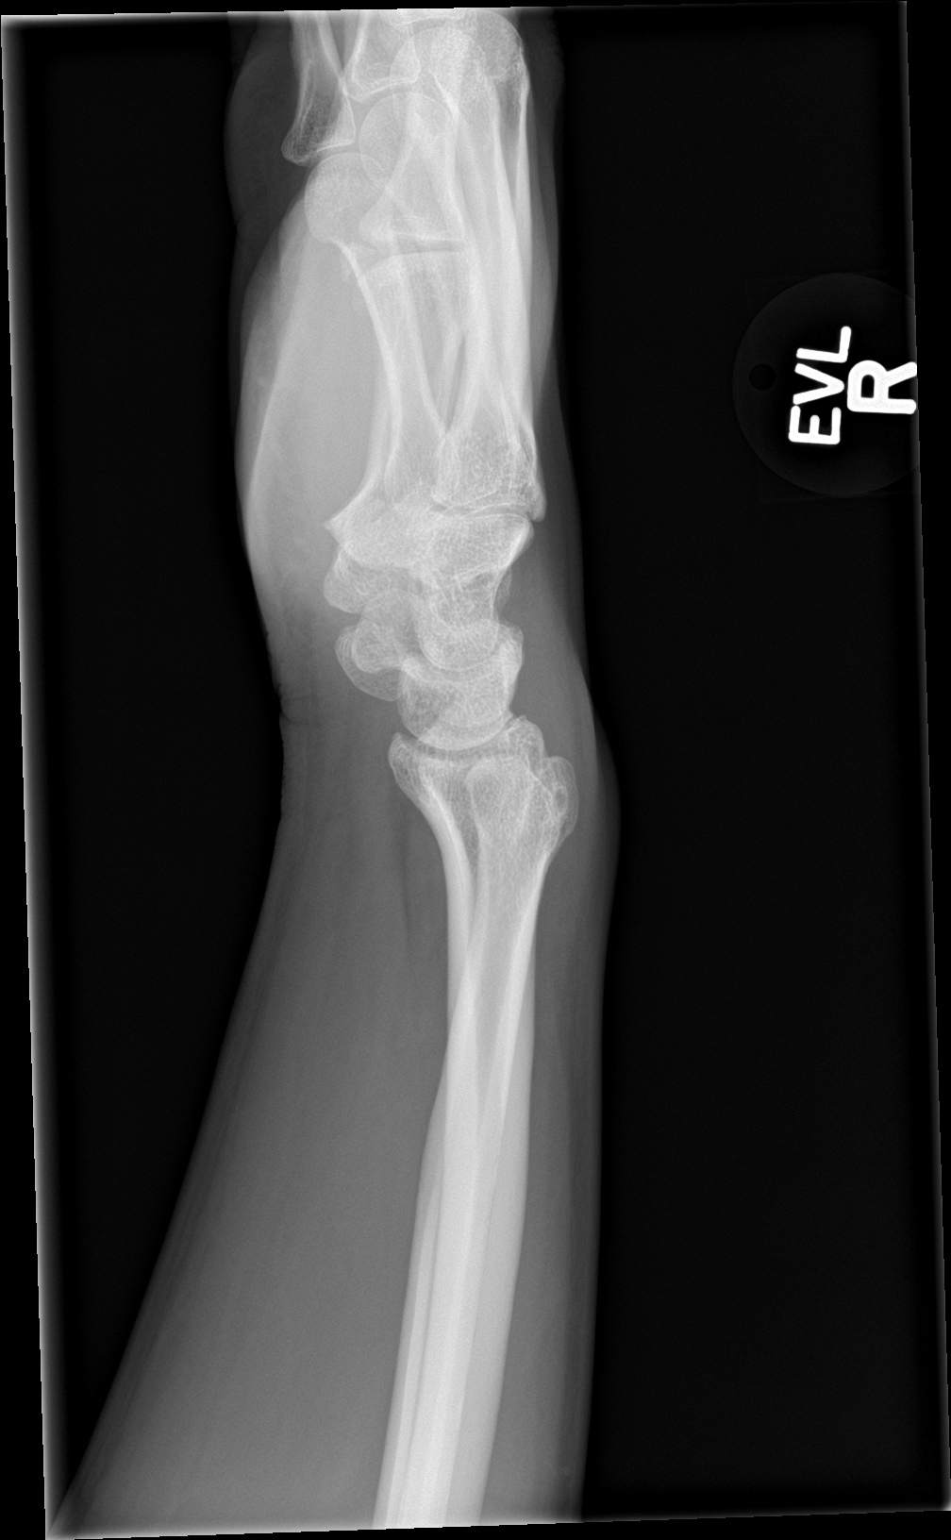

[wrist navicular]
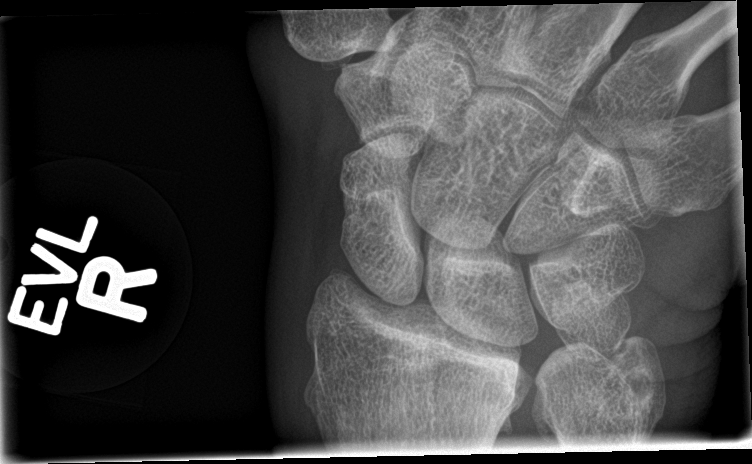

[4 of 4 positions shown; findings below may reference images not displayed]

FINDINGS: There is no evidence of fracture or dislocation. There is no
evidence of arthropathy or other focal bone abnormality. Soft
tissues are unremarkable.
IMPRESSION: Negative.

## 2021-01-02 ENCOUNTER — Encounter (HOSPITAL_BASED_OUTPATIENT_CLINIC_OR_DEPARTMENT_OTHER): Payer: Self-pay | Admitting: Emergency Medicine

## 2021-01-02 ENCOUNTER — Emergency Department (HOSPITAL_BASED_OUTPATIENT_CLINIC_OR_DEPARTMENT_OTHER)
Admission: EM | Admit: 2021-01-02 | Discharge: 2021-01-02 | Disposition: A | Discharge status: Home / Self Care | Payer: Commercial Managed Care - PPO | Attending: Emergency Medicine | Admitting: Emergency Medicine

## 2021-01-02 ENCOUNTER — Other Ambulatory Visit: Payer: Self-pay

## 2021-01-02 DIAGNOSIS — Z21 Asymptomatic human immunodeficiency virus [HIV] infection status: Secondary | ICD-10-CM | POA: Diagnosis not present

## 2021-01-02 DIAGNOSIS — Z87891 Personal history of nicotine dependence: Secondary | ICD-10-CM | POA: Diagnosis not present

## 2021-01-02 DIAGNOSIS — M62830 Muscle spasm of back: Secondary | ICD-10-CM | POA: Diagnosis not present

## 2021-01-02 DIAGNOSIS — I10 Essential (primary) hypertension: Secondary | ICD-10-CM | POA: Diagnosis not present

## 2021-01-02 DIAGNOSIS — M549 Dorsalgia, unspecified: Secondary | ICD-10-CM | POA: Diagnosis present

## 2021-01-02 MED ORDER — METHOCARBAMOL 500 MG PO TABS: 1000.0000 mg | ORAL_TABLET | Freq: Three times a day (TID) | ORAL | 0 refills | Status: DC | PRN

## 2021-01-02 MED ORDER — METHOCARBAMOL 500 MG PO TABS
1000.0000 mg | ORAL_TABLET | Freq: Once | ORAL | Status: AC
  Administered 2021-01-02: 1000 mg via ORAL
  Filled 2021-01-02: qty 2

## 2021-01-02 MED ORDER — OXYCODONE-ACETAMINOPHEN 5-325 MG PO TABS: 1.0000 | ORAL_TABLET | Freq: Four times a day (QID) | ORAL | 0 refills | Status: AC | PRN

## 2021-01-02 NOTE — ED Triage Notes (Signed)
Pt states he started having pain in his back Wednesday a week ago  Pt states he called the teledoctor and was told to take ibuprofen and took him out of work for a couple days  Pt states he went back to work on Monday and did something that hurt his back again  He states he saw one of their doctors on Wednesday she gave him ibuprofen and a muscle relaxer  Pt states he is unable to take the muscle relaxers and go to work  Pt states he did not take the muscle relaxer last night

## 2021-01-02 NOTE — ED Provider Notes (Signed)
[facility] Provider Note: [name], MD, FACEP  CSN: [id number] MRN: [id number] ARRIVAL: 01/02/21 at 0440 ROOM: [facility]09/[facility]09   CHIEF COMPLAINT  Back Pain   HISTORY OF PRESENT ILLNESS  01/02/21 6:23 AM Thomas Jimenez is a 45 y.o. male who has been having back pain for the past 8 days.  He describes the pain as feeling like spasms in the have been moving around from one place on his back to another.  He had a telemedicine visit last week and was told to take ibuprofen and stay out of work for several days.  He went back to work 3 days ago and hurt his back again.  He saw one of the doctors yesterday who gave him ibuprofen and a muscle relaxer (he thinks it might be Flexeril) but states it was too sedating.  He rates his pain as an 8 out of 10, worse with movement.   Past Medical History:  Diagnosis Date   HIV (human immunodeficiency virus infection) (HCC)    Hypertension    Irregular heart beat     History reviewed. No pertinent surgical history.  Family History  Problem Relation Age of Onset   Hypertension Mother    Diabetes Mother     Social History   Tobacco Use   Smoking status: Former    Pack years: 0.00   Smokeless tobacco: Never  Vaping Use   Vaping Use: Never used  Substance Use Topics   Alcohol use: Yes    Comment: occ   Drug use: Yes    Types: Marijuana    Prior to Admission medications   Medication Sig Start Date End Date Taking? Authorizing Provider  methocarbamol (ROBAXIN) 500 MG tablet Take 2 tablets (1,000 mg total) by mouth 3 (three) times daily as needed for muscle spasms. 01/02/21  Yes Nashla Althoff, MD  oxyCODONE-acetaminophen (PERCOCET) 5-325 MG tablet Take 1 tablet by mouth every 6 (six) hours as needed (for pain; may cause drowsiness). 01/02/21  Yes Lakeesha Fontanilla, MD  Abacavir-Dolutegravir-Lamivud (TRIUMEQ PO) Take by mouth.    [provider]  CARVEDILOL PO Take by mouth.    [provider]     Allergies Other   REVIEW OF SYSTEMS  Negative except as noted here or in the History of Present Illness.   PHYSICAL EXAMINATION  Initial Vital Signs Blood pressure 114/75, pulse (!) 48, temperature 98.9 F (37.2 C), temperature source Oral, resp. rate 16, height 5\' 9"  (1.753 m), weight 75.3 kg, SpO2 100 %.  Examination General: Well-developed, well-nourished male in no acute distress; appearance consistent with age of record HENT: normocephalic; atraumatic Eyes: Normal appearance Neck: supple Heart: regular rate and rhythm Lungs: clear to auscultation bilaterally Abdomen: soft; nondistended; nontender; bowel sounds present Back: Tender musculature notably left mid paraspinal area Extremities: No deformity; full range of motion; pulses normal Neurologic: Awake, alert and oriented; motor function intact in all extremities and symmetric; no facial droop Skin: Warm and dry Psychiatric: Normal mood and affect   RESULTS  Summary of this visit's results, reviewed and interpreted by myself:   EKG Interpretation  Date/Time:    Ventricular Rate:    PR Interval:    QRS Duration:   QT Interval:    QTC Calculation:   R Axis:     Text Interpretation:          Laboratory Studies: No results found for this or any previous visit (from the past 24 hour(s)). Imaging Studies: No results found.  ED COURSE and MDM  Nursing notes, initial and subsequent vitals signs, including pulse oximetry, reviewed and interpreted by myself.  Vitals:   01/02/21 0454 01/02/21 0457  BP:  114/75  Pulse:  (!) 48  Resp:  16  Temp:  98.9 F (37.2 C)  TempSrc:  Oral  SpO2:  100%  Weight: 75.3 kg   Height: 5\' 9"  (1.753 m)    Medications  methocarbamol (ROBAXIN) tablet 1,000 mg (has no administration in time range)    We will try Robaxin as it may be less sedating for the patient.  We will also try a narcotic at bedtime.  PROCEDURES  Procedures   ED DIAGNOSES     ICD-10-CM   1.  Spasm of back muscles  M62.830          Shelbia Scinto, , MD 01/02/21 [phone]

## 2021-04-07 ENCOUNTER — Other Ambulatory Visit: Payer: Self-pay

## 2021-04-07 ENCOUNTER — Encounter (HOSPITAL_BASED_OUTPATIENT_CLINIC_OR_DEPARTMENT_OTHER): Payer: Self-pay | Admitting: Emergency Medicine

## 2021-04-07 DIAGNOSIS — Z21 Asymptomatic human immunodeficiency virus [HIV] infection status: Secondary | ICD-10-CM | POA: Insufficient documentation

## 2021-04-07 DIAGNOSIS — Z87891 Personal history of nicotine dependence: Secondary | ICD-10-CM | POA: Insufficient documentation

## 2021-04-07 DIAGNOSIS — I10 Essential (primary) hypertension: Secondary | ICD-10-CM | POA: Insufficient documentation

## 2021-04-07 DIAGNOSIS — S39012A Strain of muscle, fascia and tendon of lower back, initial encounter: Secondary | ICD-10-CM | POA: Insufficient documentation

## 2021-04-07 DIAGNOSIS — X58XXXA Exposure to other specified factors, initial encounter: Secondary | ICD-10-CM | POA: Insufficient documentation

## 2021-04-07 NOTE — ED Triage Notes (Signed)
Pt is c/o lower back pain x 1 week  Denies injury

## 2021-04-08 ENCOUNTER — Emergency Department (HOSPITAL_BASED_OUTPATIENT_CLINIC_OR_DEPARTMENT_OTHER)
Admission: EM | Admit: 2021-04-08 | Discharge: 2021-04-08 | Disposition: A | Discharge status: Home / Self Care | Payer: Commercial Managed Care - PPO | Attending: Emergency Medicine | Admitting: Emergency Medicine

## 2021-04-08 DIAGNOSIS — S39012A Strain of muscle, fascia and tendon of lower back, initial encounter: Secondary | ICD-10-CM

## 2021-04-08 MED ORDER — METHOCARBAMOL 500 MG PO TABS: 500.0000 mg | ORAL_TABLET | Freq: Three times a day (TID) | ORAL | 0 refills | Status: AC | PRN

## 2021-04-08 MED ORDER — NAPROXEN 500 MG PO TABS: 500.0000 mg | ORAL_TABLET | Freq: Two times a day (BID) | ORAL | 0 refills | Status: AC

## 2021-04-08 NOTE — Discharge Instructions (Signed)
Begin taking naproxen as prescribed.  Begin taking Robaxin as prescribed as needed for pain not relieved with naproxen.  Follow-up with your primary doctor if symptoms are not improving in the next week. 

## 2021-04-08 NOTE — ED Provider Notes (Signed)
[facility] EMERGENCY DEPARTMENT Provider Note   CSN: [id number] Arrival date & time: 04/07/21  2323     History Chief Complaint  Patient presents with   Back Pain    Thomas Jimenez is a 45 y.o. male.  Patient is a 45 year old male with history of HIV disease, hypertension.  Patient presenting with complaints of low back pain.  This has been worsening over the past several days.  It began in the absence of any injury or trauma.  He describes constant pain to the lumbar region that is worse when he twists, turns, and changes position.  He denies any radiation of his pain into his legs and denies any bowel or bladder complaints.  He denies having taken anything for his discomfort.  The history is provided by the patient.  Back Pain Location:  Lumbar spine Quality:  Aching Radiates to:  Does not radiate Pain severity:  Moderate Onset quality:  Sudden Duration:  2 days Timing:  Constant Progression:  Worsening Chronicity:  New Relieved by:  Nothing Worsened by:  Movement     Past Medical History:  Diagnosis Date   HIV (human immunodeficiency virus infection) (HCC)    Hypertension    Irregular heart beat     There are no problems to display for this patient.   History reviewed. No pertinent surgical history.     Family History  Problem Relation Age of Onset   Hypertension Mother    Diabetes Mother     Social History   Tobacco Use   Smoking status: Former   Smokeless tobacco: Never  Building services engineer Use: Never used  Substance Use Topics   Alcohol use: Not Currently    Comment: occ   Drug use: Not Currently    Types: Marijuana    Home Medications Prior to Admission medications   Medication Sig Start Date End Date Taking? Authorizing Provider  Abacavir-Dolutegravir-Lamivud (TRIUMEQ PO) Take by mouth.    [provider]  CARVEDILOL PO Take by mouth.    [provider]  methocarbamol (ROBAXIN) 500 MG tablet Take 2 tablets  (1,000 mg total) by mouth 3 (three) times daily as needed for muscle spasms. 01/02/21   Molpus, John, MD  oxyCODONE-acetaminophen (PERCOCET) 5-325 MG tablet Take 1 tablet by mouth every 6 (six) hours as needed (for pain; may cause drowsiness). 01/02/21   Molpus, Jonny Ruiz, MD    Allergies    Other  Review of Systems   Review of Systems  Musculoskeletal:  Positive for back pain.  All other systems reviewed and are negative.  Physical Exam Updated Vital Signs BP 107/67   Pulse (!) 48   Temp 98.6 F (37 C) (Oral)   Resp 18   Ht 5\' 9"  (1.753 m)   Wt 76.7 kg   SpO2 100%   BMI 24.96 kg/m   Physical Exam Vitals and nursing note reviewed.  Constitutional:      General: He is not in acute distress.    Appearance: He is well-developed. He is not diaphoretic.  HENT:     Head: Normocephalic and atraumatic.  Cardiovascular:     Rate and Rhythm: Normal rate and regular rhythm.     Heart sounds: No murmur heard.   No friction rub.  Pulmonary:     Effort: Pulmonary effort is normal. No respiratory distress.     Breath sounds: Normal breath sounds. No wheezing or rales.  Abdominal:     General: Bowel sounds are  normal. There is no distension.     Palpations: Abdomen is soft.     Tenderness: There is no abdominal tenderness.  Musculoskeletal:        General: Normal range of motion.     Cervical back: Normal range of motion and neck supple.     Comments: There is tenderness to palpation in the soft tissues of the lumbar region.  There is no bony tenderness or step-off.  Skin:    General: Skin is warm and dry.  Neurological:     Mental Status: He is alert and oriented to person, place, and time.     Coordination: Coordination normal.     Comments: Strength is 5 out of 5 in both lower extremities.  DTRs are 1+ and symmetrical in the patellar and Achilles tendons bilaterally.  He is able to ambulate on his heels and toes.    ED Results / Procedures / Treatments   Labs (all labs ordered  are listed, but only abnormal results are displayed) Labs Reviewed - No data to display  EKG None  Radiology No results found.  Procedures Procedures   Medications Ordered in ED Medications - No data to display  ED Course  I have reviewed the triage vital signs and the nursing notes.  Pertinent labs & imaging results that were available during my care of the patient were reviewed by me and considered in my medical decision making (see chart for details).    MDM Rules/Calculators/A&P  Patient presenting with low back pain that appears musculoskeletal in nature.  There are no red flags that would suggest an emergent situation.  Patient to be discharged with anti-inflammatories, muscle relaxers, and follow-up with primary doctor if not improving.  Work excuse given.  Final Clinical Impression(s) / ED Diagnoses Final diagnoses:  None    Rx / DC Orders ED Discharge Orders     None        Geoffery Lyons, MD 04/08/21 0422

## 2021-04-09 ENCOUNTER — Emergency Department (HOSPITAL_BASED_OUTPATIENT_CLINIC_OR_DEPARTMENT_OTHER)
Admission: EM | Admit: 2021-04-09 | Discharge: 2021-04-09 | Disposition: A | Discharge status: Home / Self Care | Payer: Self-pay | Attending: Emergency Medicine | Admitting: Emergency Medicine

## 2021-04-09 ENCOUNTER — Other Ambulatory Visit: Payer: Self-pay

## 2021-04-09 ENCOUNTER — Encounter (HOSPITAL_BASED_OUTPATIENT_CLINIC_OR_DEPARTMENT_OTHER): Payer: Self-pay | Admitting: Emergency Medicine

## 2021-04-09 DIAGNOSIS — I10 Essential (primary) hypertension: Secondary | ICD-10-CM | POA: Insufficient documentation

## 2021-04-09 DIAGNOSIS — S39012D Strain of muscle, fascia and tendon of lower back, subsequent encounter: Secondary | ICD-10-CM

## 2021-04-09 DIAGNOSIS — S39012A Strain of muscle, fascia and tendon of lower back, initial encounter: Secondary | ICD-10-CM | POA: Insufficient documentation

## 2021-04-09 DIAGNOSIS — Z21 Asymptomatic human immunodeficiency virus [HIV] infection status: Secondary | ICD-10-CM | POA: Insufficient documentation

## 2021-04-09 DIAGNOSIS — Z79899 Other long term (current) drug therapy: Secondary | ICD-10-CM | POA: Insufficient documentation

## 2021-04-09 DIAGNOSIS — Z87891 Personal history of nicotine dependence: Secondary | ICD-10-CM | POA: Insufficient documentation

## 2021-04-09 DIAGNOSIS — X58XXXA Exposure to other specified factors, initial encounter: Secondary | ICD-10-CM | POA: Insufficient documentation

## 2021-04-09 NOTE — ED Triage Notes (Signed)
Pt reports intermittent lower back pain for the past 2 weeks. No known injury. Pt reports pain is worse when standing up and when he is stressed. Denies any urinary complaints.

## 2021-04-09 NOTE — Discharge Instructions (Signed)
For your back pain I recommend using Tylenol as well as ibuprofen.  A good regimen for this is take Tylenol, 3 hours later take ibuprofen, 3 hours later take Tylenol, and repeat so there is a 6-hour gap between each medication.  I do not recommend taking the ibuprofen more than 5 days in a row.  You can also use some Voltaren gel also called diclofenac gel which we discussed in the appointment today.  I do recommend that you get a primary care doctor because you may benefit from some physical therapy once you get over this initial back strain.  If you have any questions or concerns do not hesitate to return.

## 2021-04-09 NOTE — ED Provider Notes (Addendum)
[facility] EMERGENCY DEPARTMENT Provider Note   CSN: [id number] Arrival date & time: 04/09/21  2841     History Chief Complaint  Patient presents with   Back Pain    Thomas Jimenez is a 45 y.o. male.  Patient states that he has a history of back spasms over the past 2 years.  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  He does not have any known trauma but states about 2 weeks ago he had another exacerbation of her back pain.  He states this is left-sided.  It does radiate down to his knee on occasion.  He denies any saddle paresthesias, loss of bowel or bladder function, fevers, chills, shortness of breath, chest pains.  He denies any accidents or trauma.  He states that he was here recently and was planning to go to work this morning but felt his back pain was too significant and so wanted to come in today.  He does not have a regular primary doctor.      Past Medical History:  Diagnosis Date   HIV (human immunodeficiency virus infection) (HCC)    Hypertension    Irregular heart beat     There are no problems to display for this patient.   History reviewed. No pertinent surgical history.     Family History  Problem Relation Age of Onset   Hypertension Mother    Diabetes Mother     Social History   Tobacco Use   Smoking status: Former   Smokeless tobacco: Never  Building services engineer Use: Never used  Substance Use Topics   Alcohol use: Not Currently    Comment: occ   Drug use: Not Currently    Types: Marijuana    Home Medications Prior to Admission medications   Medication Sig Start Date End Date Taking? Authorizing Provider  Abacavir-Dolutegravir-Lamivud (TRIUMEQ PO) Take by mouth.    [provider]  CARVEDILOL PO Take by mouth.    [provider]  methocarbamol (ROBAXIN) 500 MG tablet Take 1 tablet (500 mg total) by mouth  every 8 (eight) hours as needed for muscle spasms. 04/08/21   Geoffery Lyons, MD  naproxen (NAPROSYN) 500 MG tablet Take 1 tablet (500 mg total) by mouth 2 (two) times daily with a meal. 04/08/21   Geoffery Lyons, MD  oxyCODONE-acetaminophen (PERCOCET) 5-325 MG tablet Take 1 tablet by mouth every 6 (six) hours as needed (for pain; may cause drowsiness). 01/02/21   Molpus, John, MD    Allergies    Other  Review of Systems   Review of Systems  Constitutional:  Negative for chills and fever.  HENT:  Positive for congestion.   Eyes:  Negative for visual disturbance.  Respiratory:  Negative for chest tightness and shortness of breath.   Cardiovascular:  Negative for chest pain.  Gastrointestinal:  Negative for abdominal distention and abdominal pain.  Musculoskeletal:  Positive for back pain.  Skin:  Negative for rash and wound.  Neurological:  Negative for headaches.   Physical Exam Updated Vital Signs BP 138/86 (BP Location: Right Arm)   Pulse 62   Temp 98.7 F (37.1 C) (Oral)   Resp 18   SpO2 100%   Physical Exam Constitutional:      General: He is not in acute distress.    Appearance: Normal appearance.  Eyes:     Pupils:  Pupils are equal, round, and reactive to light.  Cardiovascular:     Rate and Rhythm: Normal rate and regular rhythm.  Pulmonary:     Effort: Pulmonary effort is normal.     Breath sounds: Normal breath sounds.  Abdominal:     General: Abdomen is flat.     Palpations: Abdomen is soft.     Tenderness: There is no abdominal tenderness.  Musculoskeletal:        General: Tenderness (left sided lumbar, non midline) present. No swelling, deformity or signs of injury. Normal range of motion.     Cervical back: Normal range of motion and neck supple.  Skin:    General: Skin is warm and dry.     Findings: No bruising, lesion or rash.  Neurological:     General: No focal deficit present.     Mental Status: He is alert.  Psychiatric:        Mood and Affect: Mood  normal.        Behavior: Behavior normal.    ED Results / Procedures / Treatments   Labs (all labs ordered are listed, but only abnormal results are displayed) Labs Reviewed - No data to display  EKG None  Radiology No results found.  Procedures Procedures   Medications Ordered in ED Medications - No data to display  ED Course  I have reviewed the triage vital signs and the nursing notes.  Pertinent labs & imaging results that were available during my care of the patient were reviewed by me and considered in my medical decision making (see chart for details).    MDM Rules/Calculators/A&P                          45 year old male with a history of back musculoskeletal spasms over the past 2 years with an acute spasm of the past 2 weeks.  He works as a physically demanding job carrying metal but denies any known trauma.  He has no red flag symptoms, no loss of bowel or bladder function, saddle paresthesias.  He has been using some ibuprofen but has not used a lot of other modalities other than Flexeril.  He was planned to go to work today but was unable to due to some discomfort and so came in for a work note and make sure there was nothing else needs to be done.  Do not believe there is an indication for acute imaging at this time, no acute neurosurgical needs at this time, recommend continue with Tylenol as well as ibuprofen as well as Voltaren gel.  Discussed he may benefit from some physical therapy once he gets over this initial sprain.  Final Clinical Impression(s) / ED Diagnoses Final diagnoses:  Strain of lumbar region, subsequent encounter    Rx / DC Orders ED Discharge Orders     None        Jackelyn Poling, DO 04/09/21 0810    Jackelyn Poling, DO 04/09/21 1017    Maia Plan, MD 04/14/21 [phone]
